# Patient Record
Sex: Male | Born: 1995 | Race: Black or African American | Hispanic: No | Marital: Single | Smoking: Never smoker
Health system: Southern US, Community
[De-identification: ages and names within clinical notes are randomized; demographics above are authoritative.]

## PROBLEM LIST (undated history)

## (undated) DIAGNOSIS — F419 Anxiety disorder, unspecified: Secondary | ICD-10-CM

## (undated) HISTORY — PX: HERNIA REPAIR: SHX51

## (undated) HISTORY — DX: Anxiety disorder, unspecified: F41.9

---

## 2017-10-08 ENCOUNTER — Encounter (HOSPITAL_COMMUNITY): Payer: Self-pay | Admitting: Emergency Medicine

## 2017-10-08 ENCOUNTER — Ambulatory Visit (HOSPITAL_COMMUNITY)
Admission: EM | Admit: 2017-10-08 | Discharge: 2017-10-08 | Disposition: A | Discharge status: Home / Self Care | Payer: Medicaid Other | Attending: Family Medicine | Admitting: Family Medicine

## 2017-10-08 DIAGNOSIS — J029 Acute pharyngitis, unspecified: Secondary | ICD-10-CM | POA: Diagnosis present

## 2017-10-08 DIAGNOSIS — Z792 Long term (current) use of antibiotics: Secondary | ICD-10-CM | POA: Insufficient documentation

## 2017-10-08 DIAGNOSIS — R509 Fever, unspecified: Secondary | ICD-10-CM | POA: Diagnosis not present

## 2017-10-08 LAB — POCT RAPID STREP A: STREPTOCOCCUS, GROUP A SCREEN (DIRECT): NEGATIVE

## 2017-10-08 LAB — POCT INFECTIOUS MONO SCREEN: Mono Screen: NEGATIVE

## 2017-10-08 MED ORDER — AMOXICILLIN-POT CLAVULANATE 875-125 MG PO TABS: 1.0000 | ORAL_TABLET | Freq: Two times a day (BID) | ORAL | 0 refills | Status: DC

## 2017-10-08 NOTE — ED Triage Notes (Signed)
PT reports fever and sore throat for 2 weeks.

## 2017-10-08 NOTE — ED Provider Notes (Signed)
[facility]   [id number] 10/08/17 Arrival Time: 1100  ASSESSMENT & PLAN:  1. Sore throat   2. Fever, unspecified fever cause    Given his symptoms and exam will empirically treat with: Meds ordered this encounter  Medications  . amoxicillin-clavulanate (AUGMENTIN) 875-125 MG tablet    Sig: Take 1 tablet by mouth every 12 (twelve) hours.    Dispense:  20 tablet    Refill:  0    Results for orders placed or performed during the hospital encounter of 10/08/17  POCT rapid strep A [facility])  Result Value Ref Range   Streptococcus, Group A Screen (Direct) NEGATIVE NEGATIVE  Infectious mono screen, POC  Result Value Ref Range   Mono Screen NEGATIVE NEGATIVE   Labs Reviewed  CULTURE, GROUP A STREP [facility])  POCT RAPID STREP A  POCT INFECTIOUS MONO SCREEN    OTC analgesics and throat care as needed  Instructed to finish full 10 day course of antibiotics. Will follow up if not showing significant improvement over the next 24-48 hours.    Discharge Instructions      You may use over the counter ibuprofen or acetaminophen as needed.   For a sore throat, over the counter products such as [employer] Peroxyl Mouth Sore Rinse or Chloraseptic Sore Throat Spray may provide some temporary relief.  Please finish all 10 days of your antibiotic.     Reviewed expectations re: course of current medical issues. Questions answered. Outlined signs and symptoms indicating need for more acute intervention. Patient verbalized understanding. After Visit Summary given.   SUBJECTIVE:  Cameron Rocha is a 22 y.o. male who reports a sore throat. Describes as pain with swallowing; R>L. Onset gradual beginning at least one week ago; maybe longer. No respiratory symptoms. Normal PO intake but reports discomfort with swallowing. Fever reported: yes. No associated n/v/abdominal symptoms. Sick contacts: none. Ibuprofen with mild help. No neck pain.  ROS: As per  HPI.   OBJECTIVE:  Vitals:   10/08/17 1121  BP: (!) 134/94  Pulse: (!) 108  Resp: 16  Temp: 99.5 F (37.5 C)  TempSrc: Oral  SpO2: 97%  Weight: 195 lb (88.5 kg)     General appearance: alert; no distress HEENT: throat with tonsillar hypertrophy (R>L) and moderate erythema; uvula midline yes Neck: supple with FROM; R sided cervical LAD with tenderness to palpation Lungs: clear to auscultation bilaterally Skin: reveals no rash; warm and dry Psychological: alert and cooperative; normal mood and affect  No Known Allergies  History reviewed. No pertinent past medical history. Social History   Socioeconomic History  . Marital status: Single    Spouse name: Not on file  . Number of children: Not on file  . Years of education: Not on file  . Highest education level: Not on file  Occupational History  . Not on file  Social Needs  . Financial resource strain: Not on file  . Food insecurity:    Worry: Not on file    Inability: Not on file  . Transportation needs:    Medical: Not on file    Non-medical: Not on file  Tobacco Use  . Smoking status: Not on file  Substance and Sexual Activity  . Alcohol use: Not on file  . Drug use: Not on file  . Sexual activity: Not on file  Lifestyle  . Physical activity:    Days per week: Not on file    Minutes per session: Not on file  . Stress:  Not on file  Relationships  . Social connections:    Talks on phone: Not on file    Gets together: Not on file    Attends religious service: Not on file    Active member of club or organization: Not on file    Attends meetings of clubs or organizations: Not on file    Relationship status: Not on file  . Intimate partner violence:    Fear of current or ex partner: Not on file    Emotionally abused: Not on file    Physically abused: Not on file    Forced sexual activity: Not on file  Other Topics Concern  . Not on file  Social History Narrative  . Not on file   No family history on  file.        Mardella LaymanHagler, Arcadio Cope, MD 10/08/17 1343

## 2017-10-08 NOTE — Discharge Instructions (Signed)
You may use over the counter ibuprofen or acetaminophen as needed.  For a sore throat, over the counter products such as Colgate Peroxyl Mouth Sore Rinse or Chloraseptic Sore Throat Spray may provide some temporary relief. Please finish all 10 days of your antibiotic.

## 2017-10-10 LAB — CULTURE, GROUP A STREP (THRC)

## 2020-05-04 ENCOUNTER — Other Ambulatory Visit (INDEPENDENT_AMBULATORY_CARE_PROVIDER_SITE_OTHER): Payer: Medicaid Other | Admitting: Internal Medicine

## 2020-05-04 ENCOUNTER — Other Ambulatory Visit: Payer: Self-pay

## 2020-05-04 DIAGNOSIS — Z1152 Encounter for screening for COVID-19: Secondary | ICD-10-CM

## 2020-05-04 LAB — POC COVID19 BINAXNOW: SARS Coronavirus 2 Ag: NEGATIVE

## 2020-05-04 NOTE — Progress Notes (Signed)
Cameron Rocha walked up to clinic today and stated needed screening COVID testing for work. He has not had any symptoms of illness in past weeks.  He tested negative with rapid antigen testing today.

## 2020-08-25 ENCOUNTER — Emergency Department (HOSPITAL_COMMUNITY): Payer: Self-pay

## 2020-08-25 ENCOUNTER — Other Ambulatory Visit: Payer: Self-pay

## 2020-08-25 ENCOUNTER — Encounter (HOSPITAL_COMMUNITY): Payer: Self-pay | Admitting: Emergency Medicine

## 2020-08-25 ENCOUNTER — Emergency Department (HOSPITAL_COMMUNITY)
Admission: EM | Admit: 2020-08-25 | Discharge: 2020-08-25 | Disposition: A | Discharge status: Home / Self Care | Payer: Self-pay | Attending: Emergency Medicine | Admitting: Emergency Medicine

## 2020-08-25 DIAGNOSIS — Z23 Encounter for immunization: Secondary | ICD-10-CM | POA: Insufficient documentation

## 2020-08-25 DIAGNOSIS — S0081XA Abrasion of other part of head, initial encounter: Secondary | ICD-10-CM | POA: Insufficient documentation

## 2020-08-25 DIAGNOSIS — T148XXA Other injury of unspecified body region, initial encounter: Secondary | ICD-10-CM

## 2020-08-25 DIAGNOSIS — R1032 Left lower quadrant pain: Secondary | ICD-10-CM | POA: Insufficient documentation

## 2020-08-25 DIAGNOSIS — T07XXXA Unspecified multiple injuries, initial encounter: Secondary | ICD-10-CM

## 2020-08-25 DIAGNOSIS — R52 Pain, unspecified: Secondary | ICD-10-CM

## 2020-08-25 DIAGNOSIS — S53105A Unspecified dislocation of left ulnohumeral joint, initial encounter: Secondary | ICD-10-CM | POA: Insufficient documentation

## 2020-08-25 DIAGNOSIS — R000 Tachycardia, unspecified: Secondary | ICD-10-CM | POA: Insufficient documentation

## 2020-08-25 DIAGNOSIS — Y92410 Unspecified street and highway as the place of occurrence of the external cause: Secondary | ICD-10-CM | POA: Insufficient documentation

## 2020-08-25 LAB — CBC WITH DIFFERENTIAL/PLATELET
Abs Immature Granulocytes: 0.08 10*3/uL — ABNORMAL HIGH (ref 0.00–0.07)
Basophils Absolute: 0 10*3/uL (ref 0.0–0.1)
Basophils Relative: 0 %
Eosinophils Absolute: 0 10*3/uL (ref 0.0–0.5)
Eosinophils Relative: 0 %
HCT: 40 % (ref 39.0–52.0)
Hemoglobin: 13.2 g/dL (ref 13.0–17.0)
Immature Granulocytes: 1 %
Lymphocytes Relative: 12 %
Lymphs Abs: 1.7 10*3/uL (ref 0.7–4.0)
MCH: 30.9 pg (ref 26.0–34.0)
MCHC: 33 g/dL (ref 30.0–36.0)
MCV: 93.7 fL (ref 80.0–100.0)
Monocytes Absolute: 0.4 10*3/uL (ref 0.1–1.0)
Monocytes Relative: 3 %
Neutro Abs: 12.3 10*3/uL — ABNORMAL HIGH (ref 1.7–7.7)
Neutrophils Relative %: 84 %
Platelets: 321 10*3/uL (ref 150–400)
RBC: 4.27 MIL/uL (ref 4.22–5.81)
RDW: 12.8 % (ref 11.5–15.5)
WBC: 14.5 10*3/uL — ABNORMAL HIGH (ref 4.0–10.5)
nRBC: 0 % (ref 0.0–0.2)

## 2020-08-25 LAB — COMPREHENSIVE METABOLIC PANEL
ALT: 28 U/L (ref 0–44)
AST: 35 U/L (ref 15–41)
Albumin: 4.6 g/dL (ref 3.5–5.0)
Alkaline Phosphatase: 67 U/L (ref 38–126)
Anion gap: 15 (ref 5–15)
BUN: 9 mg/dL (ref 6–20)
CO2: 21 mmol/L — ABNORMAL LOW (ref 22–32)
Calcium: 9.5 mg/dL (ref 8.9–10.3)
Chloride: 106 mmol/L (ref 98–111)
Creatinine, Ser: 1.06 mg/dL (ref 0.61–1.24)
GFR, Estimated: >60 mL/min (ref >60)
Glucose, Bld: 126 mg/dL — ABNORMAL HIGH (ref 70–99)
Potassium: 3.5 mmol/L (ref 3.5–5.1)
Sodium: 142 mmol/L (ref 135–145)
Total Bilirubin: 0.4 mg/dL (ref 0.3–1.2)
Total Protein: 7.2 g/dL (ref 6.5–8.1)

## 2020-08-25 LAB — ETHANOL: Alcohol, Ethyl (B): 135 mg/dL — ABNORMAL HIGH (ref <10)

## 2020-08-25 MED ORDER — CYCLOBENZAPRINE HCL 10 MG PO TABS: 10.0000 mg | ORAL_TABLET | Freq: Two times a day (BID) | ORAL | 0 refills | Status: AC | PRN

## 2020-08-25 MED ORDER — SODIUM CHLORIDE 0.9 % IV SOLN: Freq: Once | INTRAVENOUS | Status: AC

## 2020-08-25 MED ORDER — PROPOFOL 10 MG/ML IV BOLUS
30.0000 mg | Freq: Once | INTRAVENOUS | Status: AC
  Administered 2020-08-25: 30 mg via INTRAVENOUS

## 2020-08-25 MED ORDER — IOHEXOL 300 MG/ML  SOLN
100.0000 mL | Freq: Once | INTRAMUSCULAR | Status: AC | PRN
  Administered 2020-08-25: 100 mL via INTRAVENOUS

## 2020-08-25 MED ORDER — IBUPROFEN 600 MG PO TABS: 600.0000 mg | ORAL_TABLET | Freq: Four times a day (QID) | ORAL | 0 refills | Status: AC | PRN

## 2020-08-25 MED ORDER — MORPHINE SULFATE (PF) 4 MG/ML IV SOLN
4.0000 mg | Freq: Once | INTRAVENOUS | Status: AC
  Administered 2020-08-25: 4 mg via INTRAVENOUS
  Filled 2020-08-25: qty 1

## 2020-08-25 MED ORDER — TETANUS-DIPHTH-ACELL PERTUSSIS 5-2.5-18.5 LF-MCG/0.5 IM SUSY
0.5000 mL | PREFILLED_SYRINGE | Freq: Once | INTRAMUSCULAR | Status: AC
  Administered 2020-08-25: 0.5 mL via INTRAMUSCULAR
  Filled 2020-08-25: qty 0.5

## 2020-08-25 MED ORDER — PROPOFOL 10 MG/ML IV BOLUS
0.5000 mg/kg | Freq: Once | INTRAVENOUS | Status: AC
  Administered 2020-08-25: 45.4 mg via INTRAVENOUS
  Filled 2020-08-25: qty 20

## 2020-08-25 MED ORDER — OXYCODONE-ACETAMINOPHEN 5-325 MG PO TABS: 1.0000 | ORAL_TABLET | Freq: Four times a day (QID) | ORAL | 0 refills | Status: DC | PRN

## 2020-08-25 MED ORDER — ONDANSETRON 4 MG PO TBDP: 4.0000 mg | ORAL_TABLET | Freq: Three times a day (TID) | ORAL | 0 refills | Status: AC | PRN

## 2020-08-25 MED ORDER — SODIUM CHLORIDE 0.9 % IV BOLUS
1000.0000 mL | Freq: Once | INTRAVENOUS | Status: AC
  Administered 2020-08-25: 1000 mL via INTRAVENOUS

## 2020-08-25 NOTE — ED Notes (Signed)
Patient returned from CT

## 2020-08-25 NOTE — ED Notes (Signed)
Patient ambulated in hallway with steady gait.
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
## 2020-08-25 NOTE — Discharge Instructions (Addendum)
Follow up with Dr. Roda Shutters (orthopedics) by calling his office to schedule an appointment for further care of the elbow injury.   Take medications as prescribed: Percocet for pain; Flexeril for muscle relaxation; ibuprofen for inflammation; zofran for any nausea.   Expect to be more sore over the next 36-48 hours, then you will start improving. Return to the ED with any new or concerning symptoms.
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
## 2020-09-04 ENCOUNTER — Other Ambulatory Visit: Payer: Self-pay

## 2020-09-04 ENCOUNTER — Encounter: Payer: Self-pay | Admitting: Orthopaedic Surgery

## 2020-09-04 ENCOUNTER — Ambulatory Visit (INDEPENDENT_AMBULATORY_CARE_PROVIDER_SITE_OTHER): Payer: Self-pay | Admitting: Orthopaedic Surgery

## 2020-09-04 DIAGNOSIS — S42402A Unspecified fracture of lower end of left humerus, initial encounter for closed fracture: Secondary | ICD-10-CM

## 2020-09-04 MED ORDER — HYDROCODONE-ACETAMINOPHEN 5-325 MG PO TABS: 1.0000 | ORAL_TABLET | Freq: Every day | ORAL | 0 refills | Status: DC | PRN
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
## 2020-09-25 ENCOUNTER — Ambulatory Visit: Payer: Medicaid Other | Admitting: Orthopaedic Surgery

## 2020-10-04 ENCOUNTER — Ambulatory Visit: Payer: Medicaid Other | Admitting: Orthopaedic Surgery

## 2021-11-09 ENCOUNTER — Emergency Department (HOSPITAL_COMMUNITY)
Admission: EM | Admit: 2021-11-09 | Discharge: 2021-11-09 | Disposition: A | Discharge status: Home / Self Care | Payer: Self-pay | Attending: Emergency Medicine | Admitting: Emergency Medicine

## 2021-11-09 ENCOUNTER — Emergency Department (HOSPITAL_COMMUNITY): Payer: Self-pay

## 2021-11-09 DIAGNOSIS — S61213A Laceration without foreign body of left middle finger without damage to nail, initial encounter: Secondary | ICD-10-CM

## 2021-11-09 DIAGNOSIS — S41012A Laceration without foreign body of left shoulder, initial encounter: Secondary | ICD-10-CM

## 2021-11-09 DIAGNOSIS — S51012A Laceration without foreign body of left elbow, initial encounter: Secondary | ICD-10-CM

## 2021-11-09 DIAGNOSIS — Z23 Encounter for immunization: Secondary | ICD-10-CM | POA: Insufficient documentation

## 2021-11-09 LAB — COMPREHENSIVE METABOLIC PANEL
ALT: 35 U/L (ref 0–44)
AST: 38 U/L (ref 15–41)
Albumin: 4.6 g/dL (ref 3.5–5.0)
Alkaline Phosphatase: 68 U/L (ref 38–126)
Anion gap: 15 (ref 5–15)
BUN: 9 mg/dL (ref 6–20)
CO2: 18 mmol/L — ABNORMAL LOW (ref 22–32)
Calcium: 9.3 mg/dL (ref 8.9–10.3)
Chloride: 106 mmol/L (ref 98–111)
Creatinine, Ser: 1.17 mg/dL (ref 0.61–1.24)
GFR, Estimated: >60 mL/min (ref >60)
Glucose, Bld: 142 mg/dL — ABNORMAL HIGH (ref 70–99)
Potassium: 3.3 mmol/L — ABNORMAL LOW (ref 3.5–5.1)
Sodium: 139 mmol/L (ref 135–145)
Total Bilirubin: 0.5 mg/dL (ref 0.3–1.2)
Total Protein: 7.3 g/dL (ref 6.5–8.1)

## 2021-11-09 LAB — CBC WITH DIFFERENTIAL/PLATELET
Abs Immature Granulocytes: 0.03 10*3/uL (ref 0.00–0.07)
Basophils Absolute: 0 10*3/uL (ref 0.0–0.1)
Basophils Relative: 0 %
Eosinophils Absolute: 0.1 10*3/uL (ref 0.0–0.5)
Eosinophils Relative: 1 %
HCT: 36.9 % — ABNORMAL LOW (ref 39.0–52.0)
Hemoglobin: 12.5 g/dL — ABNORMAL LOW (ref 13.0–17.0)
Immature Granulocytes: 1 %
Lymphocytes Relative: 28 %
Lymphs Abs: 1.6 10*3/uL (ref 0.7–4.0)
MCH: 31.4 pg (ref 26.0–34.0)
MCHC: 33.9 g/dL (ref 30.0–36.0)
MCV: 92.7 fL (ref 80.0–100.0)
Monocytes Absolute: 0.2 10*3/uL (ref 0.1–1.0)
Monocytes Relative: 3 %
Neutro Abs: 3.9 10*3/uL (ref 1.7–7.7)
Neutrophils Relative %: 67 %
Platelets: 339 10*3/uL (ref 150–400)
RBC: 3.98 MIL/uL — ABNORMAL LOW (ref 4.22–5.81)
RDW: 12.4 % (ref 11.5–15.5)
WBC: 5.8 10*3/uL (ref 4.0–10.5)
nRBC: 0 % (ref 0.0–0.2)

## 2021-11-09 LAB — TYPE AND SCREEN
ABO/RH(D): B POS
Antibody Screen: NEGATIVE

## 2021-11-09 MED ORDER — FENTANYL CITRATE PF 50 MCG/ML IJ SOSY
100.0000 ug | PREFILLED_SYRINGE | INTRAMUSCULAR | Status: DC | PRN
  Administered 2021-11-09: 100 ug via INTRAVENOUS
  Filled 2021-11-09: qty 2

## 2021-11-09 MED ORDER — CEPHALEXIN 500 MG PO CAPS: 500.0000 mg | ORAL_CAPSULE | Freq: Four times a day (QID) | ORAL | 0 refills | Status: AC

## 2021-11-09 MED ORDER — OXYCODONE-ACETAMINOPHEN 5-325 MG PO TABS: 2.0000 | ORAL_TABLET | ORAL | 0 refills | Status: AC | PRN

## 2021-11-09 MED ORDER — LIDOCAINE-EPINEPHRINE (PF) 2 %-1:200000 IJ SOLN
20.0000 mL | Freq: Once | INTRAMUSCULAR | Status: AC
  Administered 2021-11-09: 20 mL
  Filled 2021-11-09: qty 20

## 2021-11-09 MED ORDER — TETANUS-DIPHTH-ACELL PERTUSSIS 5-2.5-18.5 LF-MCG/0.5 IM SUSY
0.5000 mL | PREFILLED_SYRINGE | Freq: Once | INTRAMUSCULAR | Status: AC
  Administered 2021-11-09: 0.5 mL via INTRAMUSCULAR
  Filled 2021-11-09: qty 0.5

## 2021-11-09 MED ORDER — CEFAZOLIN SODIUM-DEXTROSE 2-4 GM/100ML-% IV SOLN
2.0000 g | Freq: Once | INTRAVENOUS | Status: AC
  Administered 2021-11-09: 2 g via INTRAVENOUS
  Filled 2021-11-09: qty 100
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
## 2021-11-09 NOTE — Discharge Instructions (Addendum)
                  Intensive Outpatient Programs  [facility]    [facility] [address]     [address] [place],  [place]     [place], [place] [phone]      [phone]  [facility]   [facility]  (Inpatient and outpatient)  [phone] (Suboxone and Methadone) [address]           [phone]           [facility]    [facility] - Intensive Outpatient [address]     [address] [place], [place] [address]     [place], [place]  [phone]      [phone]  [facility] (Outpatient, Inpatient, [facility]  [facility] (Groups and Residental) (insurance only) [phone]    [place], [place]          [phone]       [facility]    [facility] (for caregivers and family) [address]     [address] [place], [place]     [place], [place] [phone]      [phone]  Residential Treatment Programs  [facility]  [facility](2 years) Residential: 90 days)  [facility] [address]      [address] [place], [place]     [place], [place] [phone]      [phone] or [phone]  [facility]    [facility] [address]      [address] [place], [place]     [place], [place] [phone]      [phone]  [facility]   [facility] ([facility]) [address]     [address] [place], [place] [address]     [place], [place] [phone]      [phone] Admissions: 8am-3pm M-F  [facility]: Residential Program (90 Days)              [facility]: [facility]  [place],      [place],   [phone] or [phone]    (Walk in Hours over the weekend or by referral)   [facility]: [facility]:[phone] (for crisis  response 24 hours a day) 

## 2021-11-09 NOTE — Progress Notes (Signed)
Orthopedic Tech Progress Note Patient Details:  Cameron Rocha 14-Aug-1995 [id number]  Ortho Devices Type of Ortho Device: Short arm splint, Shoulder abduction pillow Ortho Device/Splint Location: lue Ortho Device/Splint Interventions: Ordered, Application, Adjustment   Post Interventions Patient Tolerated: Well  Al Decant 11/09/2021, 6:32 AM
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
## 2021-11-11 ENCOUNTER — Telehealth: Payer: Self-pay | Admitting: *Deleted

## 2021-11-11 ENCOUNTER — Telehealth: Payer: Self-pay

## 2021-11-11 NOTE — Telephone Encounter (Signed)
RNCM received inbound call from patient re: need for AVS and locating his wallet. This RNCM emailed the AVS to patient at [email or web address]. This RNCM notified RNCM Camille to assist with locating his wallet.   No additional TOC needs from this [facility]

## 2021-11-11 NOTE — Telephone Encounter (Signed)
RNCM received call from peer regarding pt trying to locate wallet.   RNCM searched pt room he was assigned to, lost and found and confirmed with security that a wallet belonging to pt was not present.

## 2021-11-13 NOTE — H&P (Signed)
PREOPERATIVE H&P  Chief Complaint: LEFT LONG FINGER LACERATION WITH ABSCESS  HPI: Cameron Rocha is a 26 y.o. male who presents with a diagnosis of LEFT LONG FINGER LACERATION WITH ABSCESS. Symptoms are rated as moderate to severe, and have been worsening.  This is significantly impairing activities of daily living.  He has elected for surgical management.   No past medical history on file. Past Surgical History:  Procedure Laterality Date   HERNIA REPAIR     Social History   Socioeconomic History   Marital status: Single    Spouse name: Not on file   Number of children: Not on file   Years of education: Not on file   Highest education level: Not on file  Occupational History   Not on file  Tobacco Use   Smoking status: Not on file   Smokeless tobacco: Not on file  Substance and Sexual Activity   Alcohol use: Not on file   Drug use: Yes    Types: Cocaine   Sexual activity: Not on file  Other Topics Concern   Not on file  Social History Narrative   Not on file   Social Determinants of Health   Financial Resource Strain: Not on file  Food Insecurity: Not on file  Transportation Needs: Not on file  Physical Activity: Not on file  Stress: Not on file  Social Connections: Not on file   No family history on file. No Known Allergies Prior to Admission medications   Medication Sig Start Date End Date Taking? Authorizing Provider  cephALEXin (KEFLEX) 500 MG capsule Take 1 capsule (500 mg total) by mouth 4 (four) times daily. 11/09/21   Mesner, Barbara Cower, MD  cyclobenzaprine (FLEXERIL) 10 MG tablet Take 1 tablet (10 mg total) by mouth 2 (two) times daily as needed for muscle spasms. 08/25/20   Elpidio Anis, PA-C  HYDROcodone-acetaminophen (NORCO) 5-325 MG tablet Take 1-2 tablets by mouth daily as needed. 09/04/20   Tarry Kos, MD  ibuprofen (ADVIL) 600 MG tablet Take 1 tablet (600 mg total) by mouth every 6 (six) hours as needed. 08/25/20   Elpidio Anis, PA-C  ondansetron  (ZOFRAN ODT) 4 MG disintegrating tablet Take 1 tablet (4 mg total) by mouth every 8 (eight) hours as needed for nausea or vomiting. 08/25/20   Elpidio Anis, PA-C  oxyCODONE-acetaminophen (PERCOCET) 5-325 MG tablet Take 2 tablets by mouth every 4 (four) hours as needed. 11/09/21   Mesner, Barbara Cower, MD  oxyCODONE-acetaminophen (PERCOCET/ROXICET) 5-325 MG tablet Take 1-2 tablets by mouth every 6 (six) hours as needed for severe pain. 08/25/20   Elpidio Anis, PA-C     Positive ROS: All other systems have been reviewed and were otherwise negative with the exception of those mentioned in the HPI and as above.  Physical Exam: General: Alert, no acute distress Cardiovascular: No pedal edema Respiratory: No cyanosis, no use of accessory musculature GI: No organomegaly, abdomen is soft and non-tender Skin: No lesions in the area of chief complaint Neurologic: Sensation intact distally Psychiatric: Patient is competent for consent with normal mood and affect Lymphatic: No axillary or cervical lymphadenopathy  MUSCULOSKELETAL: left middle finger dressing unwrapped, small laceration to dorsal aspect of PIP joint present, Prolene sutures present, erythema and edema present, able to squeeze out purulent discharge from wound, full finger ROM, NVI   Imaging: 3v left hand xrays show no opaque foreign body, dislocation, or visible fracture   Assessment: LEFT LONG FINGER LACERATION WITH ABSCESS  Plan: Plan for Procedure(s): INCISION AND  DRAINAGE ABSCESS LEFT MIDDLE FINGER  The risks benefits and alternatives were discussed with the patient including but not limited to the risks of nonoperative treatment, versus surgical intervention including infection, bleeding, nerve injury,  blood clots, cardiopulmonary complications, morbidity, mortality, among others, and they were willing to proceed.   Weightbearing: WBAT L hand Orthopedic devices: none Showering: keep wound dry Dressing: reinforce  PRN Medicines: Tramadol, Tylenol, Ibuprofen, Keflex +/- Bactrim or Doxy  Discharge: home Follow up: 11/20/21 at [facility] Office [phone] 11/13/2021 7:39 PM

## 2021-11-14 ENCOUNTER — Ambulatory Visit (HOSPITAL_COMMUNITY)
Admission: RE | Admit: 2021-11-14 | Discharge: 2021-11-14 | Disposition: A | Discharge status: Home / Self Care | Payer: Self-pay | Source: Ambulatory Visit | Attending: Orthopedic Surgery | Admitting: Orthopedic Surgery

## 2021-11-14 ENCOUNTER — Encounter (HOSPITAL_COMMUNITY)
Admission: RE | Disposition: A | Discharge status: Home / Self Care | Payer: Self-pay | Source: Ambulatory Visit | Attending: Orthopedic Surgery

## 2021-11-14 ENCOUNTER — Encounter (HOSPITAL_COMMUNITY): Payer: Self-pay | Admitting: Orthopedic Surgery

## 2021-11-14 ENCOUNTER — Other Ambulatory Visit: Payer: Self-pay

## 2021-11-14 ENCOUNTER — Ambulatory Visit (HOSPITAL_COMMUNITY): Payer: Self-pay | Admitting: Anesthesiology

## 2021-11-14 ENCOUNTER — Ambulatory Visit (HOSPITAL_BASED_OUTPATIENT_CLINIC_OR_DEPARTMENT_OTHER): Payer: Self-pay | Admitting: Anesthesiology

## 2021-11-14 DIAGNOSIS — S66323A Laceration of extensor muscle, fascia and tendon of left middle finger at wrist and hand level, initial encounter: Secondary | ICD-10-CM | POA: Insufficient documentation

## 2021-11-14 DIAGNOSIS — X58XXXA Exposure to other specified factors, initial encounter: Secondary | ICD-10-CM | POA: Insufficient documentation

## 2021-11-14 DIAGNOSIS — L02512 Cutaneous abscess of left hand: Secondary | ICD-10-CM | POA: Insufficient documentation

## 2021-11-14 DIAGNOSIS — S61218A Laceration without foreign body of other finger without damage to nail, initial encounter: Secondary | ICD-10-CM

## 2021-11-14 HISTORY — PX: INCISION AND DRAINAGE ABSCESS: SHX5864

## 2021-11-14 SURGERY — INCISION AND DRAINAGE, ABSCESS
Anesthesia: General | Laterality: Left

## 2021-11-14 MED ORDER — BUPIVACAINE HCL (PF) 0.25 % IJ SOLN
INTRAMUSCULAR | Status: AC
  Filled 2021-11-14: qty 30

## 2021-11-14 MED ORDER — PROPOFOL 10 MG/ML IV BOLUS
INTRAVENOUS | Status: AC
  Filled 2021-11-14: qty 20

## 2021-11-14 MED ORDER — LIDOCAINE HCL (PF) 2 % IJ SOLN
INTRAMUSCULAR | Status: AC
  Filled 2021-11-14: qty 5

## 2021-11-14 MED ORDER — MIDAZOLAM HCL 2 MG/2ML IJ SOLN
INTRAMUSCULAR | Status: AC
  Filled 2021-11-14: qty 2

## 2021-11-14 MED ORDER — DEXMEDETOMIDINE (PRECEDEX) IN NS 20 MCG/5ML (4 MCG/ML) IV SYRINGE
PREFILLED_SYRINGE | INTRAVENOUS | Status: DC | PRN
  Administered 2021-11-14: 4 ug via INTRAVENOUS
  Administered 2021-11-14 (×2): 8 ug via INTRAVENOUS

## 2021-11-14 MED ORDER — FENTANYL CITRATE PF 50 MCG/ML IJ SOSY
PREFILLED_SYRINGE | INTRAMUSCULAR | Status: AC
  Filled 2021-11-14: qty 1

## 2021-11-14 MED ORDER — CEFAZOLIN SODIUM-DEXTROSE 2-4 GM/100ML-% IV SOLN
2.0000 g | INTRAVENOUS | Status: AC
  Administered 2021-11-14: 2 g via INTRAVENOUS
  Filled 2021-11-14: qty 100

## 2021-11-14 MED ORDER — CHLORHEXIDINE GLUCONATE 0.12 % MT SOLN
15.0000 mL | Freq: Once | OROMUCOSAL | Status: AC
  Administered 2021-11-14: 15 mL via OROMUCOSAL

## 2021-11-14 MED ORDER — LACTATED RINGERS IV SOLN: INTRAVENOUS | Status: DC

## 2021-11-14 MED ORDER — LIDOCAINE HCL (CARDIAC) PF 100 MG/5ML IV SOSY
PREFILLED_SYRINGE | INTRAVENOUS | Status: DC | PRN
  Administered 2021-11-14: 60 mg via INTRAVENOUS

## 2021-11-14 MED ORDER — FENTANYL CITRATE (PF) 100 MCG/2ML IJ SOLN
INTRAMUSCULAR | Status: DC | PRN
Start: 2021-11-14 — End: 2021-11-14
  Administered 2021-11-14 (×2): 50 ug via INTRAVENOUS

## 2021-11-14 MED ORDER — SULFAMETHOXAZOLE-TRIMETHOPRIM 800-160 MG PO TABS: 1.0000 | ORAL_TABLET | Freq: Two times a day (BID) | ORAL | 0 refills | Status: AC

## 2021-11-14 MED ORDER — KETOROLAC TROMETHAMINE 30 MG/ML IJ SOLN
INTRAMUSCULAR | Status: DC | PRN
  Administered 2021-11-14: 30 mg via INTRAVENOUS

## 2021-11-14 MED ORDER — ACETAMINOPHEN 500 MG PO TABS: 1000.0000 mg | ORAL_TABLET | Freq: Once | ORAL | Status: DC

## 2021-11-14 MED ORDER — POVIDONE-IODINE 10 % EX SWAB
2.0000 | Freq: Once | CUTANEOUS | Status: AC
  Administered 2021-11-14: 2 via TOPICAL

## 2021-11-14 MED ORDER — DEXAMETHASONE SODIUM PHOSPHATE 10 MG/ML IJ SOLN
INTRAMUSCULAR | Status: AC
  Filled 2021-11-14: qty 1

## 2021-11-14 MED ORDER — ACETAMINOPHEN 500 MG PO TABS
1000.0000 mg | ORAL_TABLET | Freq: Once | ORAL | Status: AC
  Administered 2021-11-14: 1000 mg via ORAL
  Filled 2021-11-14: qty 2

## 2021-11-14 MED ORDER — SODIUM CHLORIDE 0.9 % IR SOLN
Status: DC | PRN
  Administered 2021-11-14: 1000 mL

## 2021-11-14 MED ORDER — MIDAZOLAM HCL 2 MG/2ML IJ SOLN
INTRAMUSCULAR | Status: DC | PRN
  Administered 2021-11-14: 2 mg via INTRAVENOUS

## 2021-11-14 MED ORDER — PROPOFOL 10 MG/ML IV BOLUS
INTRAVENOUS | Status: DC | PRN
  Administered 2021-11-14: 350 mg via INTRAVENOUS

## 2021-11-14 MED ORDER — DEXAMETHASONE SODIUM PHOSPHATE 10 MG/ML IJ SOLN
INTRAMUSCULAR | Status: DC | PRN
  Administered 2021-11-14: 8 mg via INTRAVENOUS

## 2021-11-14 MED ORDER — AMISULPRIDE (ANTIEMETIC) 5 MG/2ML IV SOLN: 10.0000 mg | Freq: Once | INTRAVENOUS | Status: DC | PRN

## 2021-11-14 MED ORDER — FENTANYL CITRATE (PF) 100 MCG/2ML IJ SOLN
INTRAMUSCULAR | Status: AC
  Filled 2021-11-14: qty 2

## 2021-11-14 MED ORDER — ONDANSETRON HCL 4 MG/2ML IJ SOLN
INTRAMUSCULAR | Status: DC | PRN
  Administered 2021-11-14: 4 mg via INTRAVENOUS

## 2021-11-14 MED ORDER — DEXAMETHASONE SODIUM PHOSPHATE 10 MG/ML IJ SOLN: 8.0000 mg | Freq: Once | INTRAMUSCULAR | Status: DC

## 2021-11-14 MED ORDER — FENTANYL CITRATE PF 50 MCG/ML IJ SOSY
25.0000 ug | PREFILLED_SYRINGE | INTRAMUSCULAR | Status: DC | PRN
  Administered 2021-11-14: 50 ug via INTRAVENOUS

## 2021-11-14 MED ORDER — ONDANSETRON HCL 4 MG/2ML IJ SOLN
INTRAMUSCULAR | Status: AC
  Filled 2021-11-14: qty 2

## 2021-11-14 MED ORDER — ACETAMINOPHEN 500 MG PO TABS: 1000.0000 mg | ORAL_TABLET | Freq: Three times a day (TID) | ORAL | 0 refills | Status: AC | PRN

## 2021-11-14 MED ORDER — DEXMEDETOMIDINE HCL IN NACL 80 MCG/20ML IV SOLN
INTRAVENOUS | Status: AC
  Filled 2021-11-14: qty 20

## 2021-11-14 MED ORDER — KETOROLAC TROMETHAMINE 30 MG/ML IJ SOLN
INTRAMUSCULAR | Status: AC
  Filled 2021-11-14: qty 1

## 2021-11-14 SURGICAL SUPPLY — 56 items
BAG COUNTER SPONGE SURGICOUNT (BAG) ×1 IMPLANT
BANDAGE ESMARK 6X9 LF (GAUZE/BANDAGES/DRESSINGS) IMPLANT
BLADE SURG SZ10 CARB STEEL (BLADE) ×1 IMPLANT
BNDG COHESIVE 4X5 TAN STRL LF (GAUZE/BANDAGES/DRESSINGS) ×1 IMPLANT
BNDG ELASTIC 4X5.8 VLCR STR LF (GAUZE/BANDAGES/DRESSINGS) ×1 IMPLANT
BNDG ELASTIC 6X10 VLCR STRL LF (GAUZE/BANDAGES/DRESSINGS) IMPLANT
BNDG ELASTIC 6X5.8 VLCR STR LF (GAUZE/BANDAGES/DRESSINGS) ×1 IMPLANT
BNDG ESMARK 4X9 LF (GAUZE/BANDAGES/DRESSINGS) IMPLANT
BNDG ESMARK 6X9 LF (GAUZE/BANDAGES/DRESSINGS)
BNDG GAUZE DERMACEA FLUFF 4 (GAUZE/BANDAGES/DRESSINGS) ×1 IMPLANT
CAST PADDING STERILE 6X4 (CAST SUPPLIES) IMPLANT
CNTNR URN SCR LID CUP LEK RST (MISCELLANEOUS) IMPLANT
CONT SPEC 4OZ STRL OR WHT (MISCELLANEOUS)
COVER SURGICAL LIGHT HANDLE (MISCELLANEOUS) ×1 IMPLANT
CUFF TOURN SGL QUICK 34 (TOURNIQUET CUFF) ×1
CUFF TRNQT CYL 34X4.125X (TOURNIQUET CUFF) IMPLANT
DRAPE SURG 17X23 STRL (DRAPES) IMPLANT
DRAPE U-SHAPE 47X51 STRL (DRAPES) IMPLANT
DRSG ADAPTIC 3X8 NADH LF (GAUZE/BANDAGES/DRESSINGS) IMPLANT
DURAPREP 26ML APPLICATOR (WOUND CARE) ×1 IMPLANT
ELECT REM PT RETURN 15FT ADLT (MISCELLANEOUS) IMPLANT
EVACUATOR 1/8 PVC DRAIN (DRAIN) IMPLANT
FACESHIELD WRAPAROUND (MASK) ×1 IMPLANT
FACESHIELD WRAPAROUND OR TEAM (MASK) ×1 IMPLANT
GAUZE PAD ABD 8X10 STRL (GAUZE/BANDAGES/DRESSINGS) ×1 IMPLANT
GAUZE SPONGE 4X4 12PLY STRL (GAUZE/BANDAGES/DRESSINGS) ×1 IMPLANT
GAUZE XEROFORM 1X8 LF (GAUZE/BANDAGES/DRESSINGS) ×1 IMPLANT
GLOVE BIO SURGEON STRL SZ7.5 (GLOVE) ×2 IMPLANT
GLOVE BIOGEL PI IND STRL 7.5 (GLOVE) ×1 IMPLANT
GLOVE BIOGEL PI IND STRL 8 (GLOVE) ×2 IMPLANT
GLOVE BIOGEL PI INDICATOR 7.5 (GLOVE) ×1
GLOVE BIOGEL PI INDICATOR 8 (GLOVE) ×2
GOWN STRL REUS W/ TWL LRG LVL3 (GOWN DISPOSABLE) ×1 IMPLANT
GOWN STRL REUS W/TWL LRG LVL3 (GOWN DISPOSABLE) ×1
HANDPIECE INTERPULSE COAX TIP (DISPOSABLE)
KIT BASIN OR (CUSTOM PROCEDURE TRAY) ×1 IMPLANT
KIT TURNOVER KIT A (KITS) IMPLANT
MANIFOLD NEPTUNE II (INSTRUMENTS) ×1 IMPLANT
NDL HYPO 25X1 1.5 SAFETY (NEEDLE) IMPLANT
NEEDLE HYPO 25X1 1.5 SAFETY (NEEDLE) IMPLANT
NS IRRIG 1000ML POUR BTL (IV SOLUTION) ×1 IMPLANT
PACK ORTHO EXTREMITY (CUSTOM PROCEDURE TRAY) ×1 IMPLANT
PAD ARMBOARD 7.5X6 YLW CONV (MISCELLANEOUS) ×2 IMPLANT
SET HNDPC FAN SPRY TIP SCT (DISPOSABLE) IMPLANT
SET IRRIG Y TYPE TUR BLADDER L (SET/KITS/TRAYS/PACK) IMPLANT
SPLINT PLASTER CAST XFAST 5X30 (CAST SUPPLIES) IMPLANT
STOCKINETTE 6  STRL (DRAPES) ×1
STOCKINETTE 6 STRL (DRAPES) ×1 IMPLANT
SUT ETHILON 3 0 PS 1 (SUTURE) IMPLANT
SUT PDS AB 2-0 CT2 27 (SUTURE) IMPLANT
SWAB CULTURE ESWAB REG 1ML (MISCELLANEOUS) IMPLANT
SYR CONTROL 10ML LL (SYRINGE) IMPLANT
TOWEL OR 17X26 10 PK STRL BLUE (TOWEL DISPOSABLE) ×2 IMPLANT
TUBING CONNECTING 10 (TUBING) ×1 IMPLANT
UNDERPAD 30X36 HEAVY ABSORB (UNDERPADS AND DIAPERS) ×1 IMPLANT
YANKAUER SUCT BULB TIP NO VENT (SUCTIONS) ×1 IMPLANT

## 2021-11-14 NOTE — Anesthesia Procedure Notes (Signed)
Procedure Name: LMA Insertion Date/Time: 11/14/2021 4:11 PM  Performed by: Yolonda Kida, CRNAPre-anesthesia Checklist: Patient identified, Emergency Drugs available, Suction available and Patient being monitored Patient Re-evaluated:Patient Re-evaluated prior to induction Oxygen Delivery Method: Circle system utilized Preoxygenation: Pre-oxygenation with 100% oxygen Induction Type: IV induction Ventilation: Mask ventilation without difficulty LMA: LMA inserted LMA Size: 4.0 Tube type: Oral Number of attempts: 1 Placement Confirmation: positive ETCO2 and breath sounds checked- equal and bilateral Tube secured with: Tape Dental Injury: Teeth and Oropharynx as per pre-operative assessment

## 2021-11-14 NOTE — Discharge Instructions (Signed)

## 2021-11-14 NOTE — Progress Notes (Signed)
Attempted to call patient for surgery time instructions but number on file is disconnected, attempted moms number but no answer

## 2021-11-14 NOTE — Anesthesia Postprocedure Evaluation (Signed)
Anesthesia Post Note  Patient: Cameron Rocha  Procedure(s) Performed: INCISION AND DRAINAGE ABSCESS LEFT MIDDLE FINGER (Left)     Patient location during evaluation: PACU Anesthesia Type: General Level of consciousness: awake and alert Pain management: pain level controlled Vital Signs Assessment: post-procedure vital signs reviewed and stable Respiratory status: spontaneous breathing, nonlabored ventilation, respiratory function stable and patient connected to nasal cannula oxygen Cardiovascular status: blood pressure returned to baseline and stable Postop Assessment: no apparent nausea or vomiting Anesthetic complications: no   No notable events documented.  Last Vitals:  Vitals:   11/14/21 1749 11/14/21 1800  BP: (!) 144/90 (!) 151/95  Pulse: 80 79  Resp: 17 (!) 24  Temp:  (!) 36.4 C  SpO2: 96% 98%    Last Pain:  Vitals:   11/14/21 1749  TempSrc:   PainSc: 4                  Kennieth Rad

## 2021-11-14 NOTE — Interval H&P Note (Signed)
History and Physical Interval Note:  11/14/2021 3:43 PM  I was asked by Dr. Eulah Pont to assist with his surgical care.    Cameron Rocha  has presented today for surgery, with the diagnosis of LEFT LONG FINGER LACERATION WITH ABSCESS.  The various methods of treatment have been discussed with the patient and family. After consideration of risks, benefits and other options for treatment, the patient has consented to  Procedure(s): INCISION AND DRAINAGE ABSCESS LEFT MIDDLE FINGER (Left) as a surgical intervention.  The patient's history has been reviewed, patient examined, no change in status, stable for surgery.  I have reviewed the patient's chart and labs.  Questions were answered to the patient's satisfaction.  We will also assess the integrity of his extensor tendon, and central slip, and dorsal capsule.  He may need delayed repair if damaged.   Eulas Post

## 2021-11-14 NOTE — Anesthesia Preprocedure Evaluation (Signed)
Anesthesia Evaluation  Patient identified by MRN, date of birth, ID band Patient awake    Reviewed: Allergy & Precautions, NPO status , Patient's Chart, lab work & pertinent test results  Airway Mallampati: II  TM Distance: >3 FB Neck ROM: Full    Dental  (+) Dental Advisory Given   Pulmonary neg pulmonary ROS,    breath sounds clear to auscultation       Cardiovascular negative cardio ROS   Rhythm:Regular Rate:Normal     Neuro/Psych negative neurological ROS     GI/Hepatic negative GI ROS, Neg liver ROS,   Endo/Other  negative endocrine ROS  Renal/GU negative Renal ROS     Musculoskeletal   Abdominal   Peds  Hematology negative hematology ROS (+)   Anesthesia Other Findings   Reproductive/Obstetrics                             Anesthesia Physical Anesthesia Plan  ASA: 1  Anesthesia Plan: General   Post-op Pain Management: Tylenol PO (pre-op)* and Toradol IV (intra-op)*   Induction: Intravenous  PONV Risk Score and Plan: 2 and Dexamethasone, Ondansetron and Treatment may vary due to age or medical condition  Airway Management Planned: LMA  Additional Equipment:   Intra-op Plan:   Post-operative Plan: Extubation in OR  Informed Consent: I have reviewed the patients History and Physical, chart, labs and discussed the procedure including the risks, benefits and alternatives for the proposed anesthesia with the patient or authorized representative who has indicated his/her understanding and acceptance.     Dental advisory given  Plan Discussed with: CRNA  Anesthesia Plan Comments:         Anesthesia Quick Evaluation

## 2021-11-14 NOTE — Transfer of Care (Signed)
Immediate Anesthesia Transfer of Care Note  Patient: Kadon Andrus  Procedure(s) Performed: INCISION AND DRAINAGE ABSCESS LEFT MIDDLE FINGER (Left)  Patient Location: PACU  Anesthesia Type:General  Level of Consciousness: drowsy  Airway & Oxygen Therapy: Patient Spontanous Breathing and Patient connected to face mask oxygen  Post-op Assessment: Report given to RN and Post -op Vital signs reviewed and stable  Post vital signs: Reviewed and stable  Last Vitals:  Vitals Value Taken Time  BP 152/102 11/14/21 1701  Temp    Pulse 112 11/14/21 1703  Resp 26 11/14/21 1703  SpO2 97 % 11/14/21 1703  Vitals shown include unvalidated device data.  Last Pain:  Vitals:   11/14/21 1439  TempSrc:   PainSc: 0-No pain         Complications: No notable events documented.

## 2021-11-14 NOTE — Interval H&P Note (Signed)
History and Physical Interval Note:  11/14/2021 4:15 PM  Cameron Rocha  has presented today for surgery, with the diagnosis of LEFT LONG FINGER LACERATION WITH ABSCESS.  The various methods of treatment have been discussed with the patient and family. After consideration of risks, benefits and other options for treatment, the patient has consented to  Procedure(s): INCISION AND DRAINAGE ABSCESS LEFT MIDDLE FINGER (Left) as a surgical intervention.  The patient's history has been reviewed, patient examined, no change in status, stable for surgery.  I have reviewed the patient's chart and labs.  Questions were answered to the patient's satisfaction.     Sheral Apley

## 2021-11-15 ENCOUNTER — Encounter (HOSPITAL_COMMUNITY): Payer: Self-pay | Admitting: Orthopedic Surgery

## 2021-11-15 NOTE — Op Note (Signed)
11/14/2021  8:18 AM  PATIENT:  Cameron Rocha    PRE-OPERATIVE DIAGNOSIS:  LEFT LONG FINGER LACERATION WITH ABSCESS  POST-OPERATIVE DIAGNOSIS:  Same  PROCEDURE:  INCISION AND DRAINAGE ABSCESS LEFT MIDDLE FINGER  SURGEON:  Sheral Apley, MD  ASSISTANT: Levester Fresh, PA-C, he was present and scrubbed throughout the case, critical for completion in a timely fashion, and for retraction, instrumentation, and closure.   ANESTHESIA:   gen  PREOPERATIVE INDICATIONS:  [name] is a  26 y.o. male with a diagnosis of LEFT LONG FINGER LACERATION WITH ABSCESS who failed conservative measures and elected for surgical management.    The risks benefits and alternatives were discussed with the patient preoperatively including but not limited to the risks of infection, bleeding, nerve injury, cardiopulmonary complications, the need for revision surgery, among others, and the patient was willing to proceed.  OPERATIVE IMPLANTS: none  OPERATIVE FINDINGS: PIP jiont involed in laceeration as was the extensor tendon  BLOOD LOSS: min  COMPLICATIONS: none  TOURNIQUET TIME: 30  OPERATIVE PROCEDURE:  Patient was identified in the preoperative holding area and site was marked by me He was transported to the operating theater and placed on the table in supine position taking care to pad all bony prominences. After a preincinduction time out anesthesia was induced. The left upper extremity was prepped and draped in normal sterile fashion and a pre-incision timeout was performed. He received Ancef for preoperative antibiotics.   I removed his previous and stitches his previous stitches and he had purulent fluid underneath this wound closure.  I irrigated this I then performed an excisional debridement with a rondure of any necrotic tissue or devitalized tissue  I explored his wound there was a laceration of his extensor tendon.  There is also violation of the PIP joint.  I exposed this joint and  thoroughly irrigated it with a liter of saline.  I then closed the joint capsule.  Next I repaired  Next I repaired the extensor tendon to the finger.  I then performed a complex closure of his skin wound  He was placed in a sterile dressing and volar splint  POST OPERATIVE PLAN: Mobilize for DVT prophylaxis splint full-time p.o. antibiotics

## 2021-11-19 LAB — AEROBIC/ANAEROBIC CULTURE W GRAM STAIN (SURGICAL/DEEP WOUND): Gram Stain: NONE SEEN

## 2021-12-24 ENCOUNTER — Encounter (HOSPITAL_COMMUNITY): Payer: Self-pay | Admitting: Emergency Medicine

## 2021-12-24 ENCOUNTER — Emergency Department (HOSPITAL_COMMUNITY)
Admission: EM | Admit: 2021-12-24 | Discharge: 2021-12-25 | Disposition: A | Discharge status: Home / Self Care | Payer: No Typology Code available for payment source | Attending: Emergency Medicine | Admitting: Emergency Medicine

## 2021-12-24 ENCOUNTER — Other Ambulatory Visit: Payer: Self-pay

## 2021-12-24 DIAGNOSIS — W268XXA Contact with other sharp object(s), not elsewhere classified, initial encounter: Secondary | ICD-10-CM | POA: Diagnosis not present

## 2021-12-24 DIAGNOSIS — S6991XA Unspecified injury of right wrist, hand and finger(s), initial encounter: Secondary | ICD-10-CM | POA: Diagnosis present

## 2021-12-24 DIAGNOSIS — Z23 Encounter for immunization: Secondary | ICD-10-CM | POA: Insufficient documentation

## 2021-12-24 DIAGNOSIS — S61011A Laceration without foreign body of right thumb without damage to nail, initial encounter: Secondary | ICD-10-CM | POA: Insufficient documentation

## 2021-12-24 DIAGNOSIS — Y93G1 Activity, food preparation and clean up: Secondary | ICD-10-CM | POA: Insufficient documentation

## 2021-12-24 NOTE — ED Triage Notes (Signed)
Pt reports cutting the tip of his right thumb on a plate, bleeding controlled at this time

## 2021-12-25 MED ORDER — TETANUS-DIPHTH-ACELL PERTUSSIS 5-2.5-18.5 LF-MCG/0.5 IM SUSY
0.5000 mL | PREFILLED_SYRINGE | Freq: Once | INTRAMUSCULAR | Status: AC
  Administered 2021-12-25: 0.5 mL via INTRAMUSCULAR
  Filled 2021-12-25: qty 0.5

## 2021-12-25 MED ORDER — BACITRACIN ZINC 500 UNIT/GM EX OINT
TOPICAL_OINTMENT | Freq: Two times a day (BID) | CUTANEOUS | Status: DC
  Administered 2021-12-25: 1 via TOPICAL

## 2021-12-25 MED ORDER — ACETAMINOPHEN 325 MG PO TABS
650.0000 mg | ORAL_TABLET | Freq: Once | ORAL | Status: AC
Start: 2021-12-25 — End: 2021-12-25
  Administered 2021-12-25: 650 mg via ORAL
  Filled 2021-12-25: qty 2

## 2021-12-25 NOTE — ED Notes (Signed)
Laceration irrigated, debrided, dried. Covered with non-stick dressing and wrapped with sterile gauze. Pt provided extra sterile gauze, non-adherent dressing, instructed on home wound care and referenced attached d/c information.

## 2021-12-25 NOTE — ED Provider Notes (Signed)
[facility] DEPT Provider Note   CSN: [id number] Arrival date & time: 12/24/21  2106     History  Chief Complaint  Patient presents with   Laceration    Cameron Rocha is a 26 y.o. male without significant pmhx who presents to the ED for evaluation of right thumb laceration that occurred just PTA. Patient reports he was washing dishes and accidentally cut the finger on a broken plate. Area is painful. No alleviating/aggravating factors. Denies numbness, tingling, or weakness. Unsure of last tetanus.   HPI     Home Medications Prior to Admission medications   Medication Sig Start Date End Date Taking? Authorizing Provider  acetaminophen (TYLENOL) 500 MG tablet Take 2 tablets (1,000 mg total) by mouth every 8 (eight) hours as needed for mild pain or moderate pain. 11/14/21   Britt Bottom, PA-C  cephALEXin (KEFLEX) 500 MG capsule Take 1 capsule (500 mg total) by mouth 4 (four) times daily. 11/09/21   Mesner, Corene Cornea, MD  cyclobenzaprine (FLEXERIL) 10 MG tablet Take 1 tablet (10 mg total) by mouth 2 (two) times daily as needed for muscle spasms. 08/25/20   Charlann Lange, PA-C  ibuprofen (ADVIL) 600 MG tablet Take 1 tablet (600 mg total) by mouth every 6 (six) hours as needed. 08/25/20   Charlann Lange, PA-C  ondansetron (ZOFRAN ODT) 4 MG disintegrating tablet Take 1 tablet (4 mg total) by mouth every 8 (eight) hours as needed for nausea or vomiting. 08/25/20   Charlann Lange, PA-C  oxyCODONE-acetaminophen (PERCOCET) 5-325 MG tablet Take 2 tablets by mouth every 4 (four) hours as needed. 11/09/21   Mesner, Corene Cornea, MD      Allergies    Patient has no known allergies.    Review of Systems   Review of Systems  Constitutional:  Negative for chills and fever.  Respiratory:  Negative for shortness of breath.   Cardiovascular:  Negative for chest pain.  Gastrointestinal:  Negative for abdominal pain.  Skin:  Positive for wound (painful).  Neurological:  Negative  for weakness and numbness.  All other systems reviewed and are negative.   Physical Exam Updated Vital Signs BP (!) 139/104 (BP Location: Left Arm)   Pulse 82   Temp 98.8 F (37.1 C) (Oral)   Resp 18   Ht 5\' 10"  (1.778 m)   Wt 111.1 kg   SpO2 96%   BMI 35.15 kg/m  Physical Exam Vitals and nursing note reviewed.  Constitutional:      General: He is not in acute distress.    Appearance: He is well-developed.  HENT:     Head: Normocephalic and atraumatic.  Eyes:     General:        Right eye: No discharge.        Left eye: No discharge.     Conjunctiva/sclera: Conjunctivae normal.  Pulmonary:     Effort: No respiratory distress.  Musculoskeletal:     Comments: Upper extremities: Patient has a laceration that is somewhat of an avulsion to the distal  aspect of the right first digit, this extends to just below the nail.  This does not appear to involve the nailbed.  There is no active bleeding.  No significant deep subcutaneous tissue exposure.  The avulsion skin flap has some discoloration however the remainder of the digit has good cap refill.  Is able to range the IP joint without difficulty against resistance.  Other than over the wound directly he has no significant tenderness to palpation.  There is no drainage, erythema, or bruising.  Skin:    General: Skin is warm and dry.  Neurological:     Mental Status: He is alert.     Comments: Clear speech.  Sensation grossly tact bilateral upper extremities.  5 out of 5 symmetric grip strength.  Psychiatric:        Behavior: Behavior normal.        Thought Content: Thought content normal.     ED Results / Procedures / Treatments   Labs (all labs ordered are listed, but only abnormal results are displayed) Labs Reviewed - No data to display  EKG None  Radiology No results found.  Procedures Procedures    Medications Ordered in ED Medications  bacitracin ointment (has no administration in time range)  Tdap  (BOOSTRIX) injection 0.5 mL (0.5 mLs Intramuscular Given 12/25/21 0118)  acetaminophen (TYLENOL) tablet 650 mg (650 mg Oral Given 12/25/21 0118)    ED Course/ Medical Decision Making/ A&P                           Medical Decision Making Risk OTC drugs. Prescription drug management.   Patient presents to the emergency department for evaluation of right thumb injury.  Nontoxic, resting comfortably, blood pressure mildly elevated, doubt hypertensive emergency.  Chart/Nursing note reviewed.  Tetanus updated in the ED.  Laceration was irrigated in the ED, no active bleeding.  No significant subcutaneous tissue exposure, somewhat of an avulsion injury with distal skin appearing somewhat discolored, suspect this may be nonviable, we discussed risks/benefits of suture closure versus secondary closure, shared decision making will apply abx ointment and bandage with secondary closure- patient in agreement. Based on mechanism w/ good ROM and no additional significant tenderness- low suspicion for underlying fx/dislocation. Appears appropriate for discharge.   I discussed results, treatment plan, need for follow-up, and return precautions with the patient. Provided opportunity for questions, patient confirmed understanding and is in agreement with plan.     Final Clinical Impression(s) / ED Diagnoses Final diagnoses:  Injury of finger of right hand, initial encounter    Rx / DC Orders ED Discharge Orders     None         Amaryllis Dyke, PA-C 12/25/21 0130    Merryl Hacker, MD 12/25/21 [phone]

## 2021-12-25 NOTE — Discharge Instructions (Signed)
Follow attached wound care.  Do not submerge your wound in water for at least 2 weeks.  Follow-up primary care for recheck of the wound within 1 week.  Return to the emergency department for new or worsening symptoms including but not limited to uncontrolled bleeding, redness/swelling/drainage from the wound, fever, trouble moving the joints of the finger, or any other concerns.

## 2022-03-13 ENCOUNTER — Telehealth: Payer: Self-pay

## 2022-03-13 NOTE — Telephone Encounter (Signed)
LVM. AS, CMA 
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
